# Patient Record
Sex: Male | Born: 1990 | Race: White | Hispanic: No | Marital: Single | Smoking: Never smoker
Health system: Southern US, Community
[De-identification: ages and names within clinical notes are randomized; demographics above are authoritative.]

## PROBLEM LIST (undated history)

## (undated) DIAGNOSIS — F419 Anxiety disorder, unspecified: Secondary | ICD-10-CM

## (undated) DIAGNOSIS — N200 Calculus of kidney: Secondary | ICD-10-CM

## (undated) HISTORY — PX: WISDOM TOOTH EXTRACTION: SHX21

---

## 2013-04-03 ENCOUNTER — Emergency Department (HOSPITAL_BASED_OUTPATIENT_CLINIC_OR_DEPARTMENT_OTHER)
Admission: EM | Admit: 2013-04-03 | Discharge: 2013-04-03 | Disposition: A | Discharge status: Home / Self Care | Payer: 59 | Attending: Emergency Medicine | Admitting: Emergency Medicine

## 2013-04-03 ENCOUNTER — Encounter (HOSPITAL_BASED_OUTPATIENT_CLINIC_OR_DEPARTMENT_OTHER): Payer: Self-pay | Admitting: Emergency Medicine

## 2013-04-03 DIAGNOSIS — N4889 Other specified disorders of penis: Secondary | ICD-10-CM

## 2013-04-03 DIAGNOSIS — N200 Calculus of kidney: Secondary | ICD-10-CM | POA: Insufficient documentation

## 2013-04-03 DIAGNOSIS — N489 Disorder of penis, unspecified: Secondary | ICD-10-CM | POA: Insufficient documentation

## 2013-04-03 HISTORY — DX: Calculus of kidney: N20.0

## 2013-04-03 LAB — URINALYSIS, ROUTINE W REFLEX MICROSCOPIC
Leukocytes, UA: NEGATIVE
Protein, ur: NEGATIVE mg/dL
Specific Gravity, Urine: 1.023 (ref 1.005–1.030)
Urobilinogen, UA: 0.2 mg/dL (ref 0.0–1.0)
pH: 5.5 (ref 5.0–8.0)

## 2013-04-03 LAB — BASIC METABOLIC PANEL
GFR calc Af Amer: >90 mL/min (ref >90)
GFR calc non Af Amer: 85 mL/min — ABNORMAL LOW (ref >90)
Glucose, Bld: 112 mg/dL — ABNORMAL HIGH (ref 70–99)
Potassium: 4.3 mEq/L (ref 3.5–5.1)
Sodium: 139 mEq/L (ref 135–145)

## 2013-04-03 LAB — URINE MICROSCOPIC-ADD ON

## 2013-04-03 MED ORDER — HYDROCODONE-ACETAMINOPHEN 5-325 MG PO TABS
2.0000 | ORAL_TABLET | Freq: Once | ORAL | Status: AC
  Administered 2013-04-03: 2 via ORAL
  Filled 2013-04-03: qty 2

## 2013-04-03 MED ORDER — TAMSULOSIN HCL 0.4 MG PO CAPS: 0.4000 mg | ORAL_CAPSULE | Freq: Once | ORAL | Status: DC

## 2013-04-03 MED ORDER — TAMSULOSIN HCL 0.4 MG PO CAPS
0.4000 mg | ORAL_CAPSULE | Freq: Once | ORAL | Status: AC
  Administered 2013-04-03: 0.4 mg via ORAL
  Filled 2013-04-03: qty 1

## 2013-04-03 MED ORDER — HYDROCODONE-ACETAMINOPHEN 5-325 MG PO TABS: 1.0000 | ORAL_TABLET | Freq: Four times a day (QID) | ORAL | Status: DC | PRN

## 2013-04-03 NOTE — ED Notes (Signed)
Pt reports flank pain this am and possibly passed a kidney stone.  He now has penile pain.

## 2013-04-03 NOTE — ED Notes (Signed)
MD at bedside. 

## 2013-04-03 NOTE — ED Provider Notes (Signed)
CSN: [id number]     Arrival date & time 04/03/13  0848 History   First MD Initiated Contact with Patient 04/03/13 [phone]     Chief Complaint  Patient presents with  . Penis Pain   (Consider location/radiation/quality/duration/timing/severity/associated sxs/prior Treatment) HPI Comments: History of stones. Passed a stone this morning, stinging pain at tip of urethra since. No fever, no vomiting, no hematuria. Has had this stinging sensation once previously. No new partners, no concern for STD.  Patient is a 22 y.o. male presenting with penile pain. The history is provided by the patient.  Penis Pain This is a new problem. The current episode started 3 to 5 hours ago. The problem occurs constantly. The problem has not changed since onset.Pertinent negatives include no chest pain, no abdominal pain and no shortness of breath. Nothing aggravates the symptoms. Nothing relieves the symptoms.    Past Medical History  Diagnosis Date  . Kidney stones    Past Surgical History  Procedure Laterality Date  . Wisdom tooth extraction     No family history on file. History  Substance Use Topics  . Smoking status: Never Smoker   . Smokeless tobacco: Not on file  . Alcohol Use: No    Review of Systems  Constitutional: Negative for fever.  Respiratory: Negative for cough and shortness of breath.   Cardiovascular: Negative for chest pain.  Gastrointestinal: Negative for abdominal pain.  Genitourinary: Positive for dysuria (stinging), flank pain and penile pain (stinging at meatus). Negative for urgency, penile swelling, difficulty urinating and testicular pain.  All other systems reviewed and are negative.    Allergies  Review of patient's allergies indicates no known allergies.  Home Medications   Current Outpatient Rx  Name  Route  Sig  Dispense  Refill  . HYDROcodone-acetaminophen (NORCO/VICODIN) 5-325 MG per tablet   Oral   Take 1 tablet by mouth every 6 (six) hours as needed for  moderate pain.   30 tablet   0   . tamsulosin (FLOMAX) 0.4 MG CAPS capsule   Oral   Take 1 capsule (0.4 mg total) by mouth once.   30 capsule   1    BP 136/91  Pulse 111  Temp(Src) 97.9 F (36.6 C) (Oral)  Resp 16  Ht 5\' 10"  (1.778 m)  Wt 150 lb (68.04 kg)  BMI 21.52 kg/m2  SpO2 97% Physical Exam  Nursing note and vitals reviewed. Constitutional: He is oriented to person, place, and time. He appears well-developed and well-nourished. No distress.  HENT:  Head: Normocephalic and atraumatic.  Mouth/Throat: No oropharyngeal exudate.  Eyes: EOM are normal. Pupils are equal, round, and reactive to light.  Neck: Normal range of motion. Neck supple.  Cardiovascular: Normal rate and regular rhythm.  Exam reveals no friction rub.   No murmur heard. Pulmonary/Chest: Effort normal and breath sounds normal. No respiratory distress. He has no wheezes. He has no rales.  Abdominal: He exhibits no distension. There is no tenderness. There is no rebound.  Genitourinary: Right testis shows no mass, no swelling and no tenderness. Left testis shows no mass, no swelling and no tenderness. No penile erythema or penile tenderness. No discharge found.  Musculoskeletal: Normal range of motion. He exhibits no edema.  Neurological: He is alert and oriented to person, place, and time.  Skin: He is not diaphoretic.    ED Course  Procedures (including critical care time) Labs Review Labs Reviewed  URINALYSIS, ROUTINE W REFLEX MICROSCOPIC - Abnormal; Notable for the following:  Hgb urine dipstick SMALL (*)    All other components within normal limits  BASIC METABOLIC PANEL - Abnormal; Notable for the following:    Glucose, Bld 112 (*)    GFR calc non Af Amer 85 (*)    All other components within normal limits  URINE MICROSCOPIC-ADD ON - Abnormal; Notable for the following:    Bacteria, UA FEW (*)    All other components within normal limits  URINE CULTURE   Imaging Review No results  found.  EKG Interpretation   None       MDM   1. Penis pain   2. Kidney stone    68M with hx of kidney stones presents with urethral pain. He states he passed a kidney stone this morning and has experienced stinging at the tip of his penis since. He has had this before with his kidney stones. He denies any urethral discharge or concern for STD. He denies fevers, vomiting, other systemic symptoms. His exam is benign. He has never required lithotripsy or stent placement for his kidney stones before. I offered CT scan and workup for stones and discussed holding off for now since he has never had a complicated stone and his vitals are stable. He really wants his flomax refilled and declined at CT scan at this time. I feel this is appropriate, he did agree to [facility] and Urine to see if his kidney function is ok and if he is infected. Patient given pain meds and flomax here.  Labs normal. Stable for discharge. Given Urology f/u if needed.    Dagmar Hait, MD 04/03/13 1040

## 2013-04-04 LAB — URINE CULTURE: Culture: NO GROWTH

## 2015-04-14 ENCOUNTER — Encounter (HOSPITAL_BASED_OUTPATIENT_CLINIC_OR_DEPARTMENT_OTHER): Payer: Self-pay | Admitting: *Deleted

## 2015-04-14 ENCOUNTER — Emergency Department (HOSPITAL_BASED_OUTPATIENT_CLINIC_OR_DEPARTMENT_OTHER)
Admission: EM | Admit: 2015-04-14 | Discharge: 2015-04-14 | Disposition: A | Discharge status: Home / Self Care | Payer: 59 | Attending: Emergency Medicine | Admitting: Emergency Medicine

## 2015-04-14 DIAGNOSIS — Z79899 Other long term (current) drug therapy: Secondary | ICD-10-CM | POA: Insufficient documentation

## 2015-04-14 DIAGNOSIS — R109 Unspecified abdominal pain: Secondary | ICD-10-CM | POA: Diagnosis present

## 2015-04-14 DIAGNOSIS — N200 Calculus of kidney: Secondary | ICD-10-CM | POA: Insufficient documentation

## 2015-04-14 LAB — URINALYSIS, ROUTINE W REFLEX MICROSCOPIC
Bilirubin Urine: NEGATIVE
Glucose, UA: NEGATIVE mg/dL
KETONES UR: NEGATIVE mg/dL
LEUKOCYTES UA: NEGATIVE
NITRITE: NEGATIVE
PH: 8.5 — AB (ref 5.0–8.0)
Protein, ur: NEGATIVE mg/dL
SPECIFIC GRAVITY, URINE: 1.018 (ref 1.005–1.030)

## 2015-04-14 LAB — URINE MICROSCOPIC-ADD ON

## 2015-04-14 MED ORDER — ONDANSETRON HCL 4 MG/2ML IJ SOLN
4.0000 mg | Freq: Once | INTRAMUSCULAR | Status: AC
  Administered 2015-04-14: 4 mg via INTRAVENOUS
  Filled 2015-04-14: qty 2

## 2015-04-14 MED ORDER — TAMSULOSIN HCL 0.4 MG PO CAPS
0.4000 mg | ORAL_CAPSULE | Freq: Once | ORAL | Status: AC
  Administered 2015-04-14: 0.4 mg via ORAL
  Filled 2015-04-14: qty 1

## 2015-04-14 MED ORDER — TAMSULOSIN HCL 0.4 MG PO CAPS: 0.4000 mg | ORAL_CAPSULE | Freq: Every day | ORAL | Status: DC

## 2015-04-14 MED ORDER — OXYCODONE-ACETAMINOPHEN 5-325 MG PO TABS: 2.0000 | ORAL_TABLET | ORAL | Status: DC | PRN

## 2015-04-14 MED ORDER — HYDROMORPHONE HCL 1 MG/ML IJ SOLN
1.0000 mg | Freq: Once | INTRAMUSCULAR | Status: AC
  Administered 2015-04-14: 1 mg via INTRAVENOUS
  Filled 2015-04-14: qty 1

## 2015-04-14 MED ORDER — KETOROLAC TROMETHAMINE 30 MG/ML IJ SOLN
30.0000 mg | Freq: Once | INTRAMUSCULAR | Status: AC
  Administered 2015-04-14: 30 mg via INTRAVENOUS
  Filled 2015-04-14: qty 1

## 2015-04-14 NOTE — Discharge Instructions (Signed)
Kidney Stones °Kidney stones (urolithiasis) are deposits that form inside your kidneys. The intense pain is caused by the stone moving through the urinary tract. When the stone moves, the ureter goes into spasm around the stone. The stone is usually passed in the urine.  °CAUSES  °· A disorder that makes certain neck glands produce too much parathyroid hormone (primary hyperparathyroidism). °· A buildup of uric acid crystals, similar to gout in your joints. °· Narrowing (stricture) of the ureter. °· A kidney obstruction present at birth (congenital obstruction). °· Previous surgery on the kidney or ureters. °· Numerous kidney infections. °SYMPTOMS  °· Feeling sick to your stomach (nauseous). °· Throwing up (vomiting). °· Blood in the urine (hematuria). °· Pain that usually spreads (radiates) to the groin. °· Frequency or urgency of urination. °DIAGNOSIS  °· Taking a history and physical exam. °· Blood or urine tests. °· CT scan. °· Occasionally, an examination of the inside of the urinary bladder (cystoscopy) is performed. °TREATMENT  °· Observation. °· Increasing your fluid intake. °· Extracorporeal shock wave lithotripsy--This is a noninvasive procedure that uses shock waves to break up kidney stones. °· Surgery may be needed if you have severe pain or persistent obstruction. There are various surgical procedures. Most of the procedures are performed with the use of small instruments. Only small incisions are needed to accommodate these instruments, so recovery time is minimized. °The size, location, and chemical composition are all important variables that will determine the proper choice of action for you. Talk to your health care provider to better understand your situation so that you will minimize the risk of injury to yourself and your kidney.  °HOME CARE INSTRUCTIONS  °· Drink enough water and fluids to keep your urine clear or pale yellow. This will help you to pass the stone or stone fragments. °· Strain  all urine through the provided strainer. Keep all particulate matter and stones for your health care provider to see. The stone causing the pain may be as small as a grain of salt. It is very important to use the strainer each and every time you pass your urine. The collection of your stone will allow your health care provider to analyze it and verify that a stone has actually passed. The stone analysis will often identify what you can do to reduce the incidence of recurrences. °· Only take over-the-counter or prescription medicines for pain, discomfort, or fever as directed by your health care provider. °· Keep all follow-up visits as told by your health care provider. This is important. °· Get follow-up X-rays if required. The absence of pain does not always mean that the stone has passed. It may have only stopped moving. If the urine remains completely obstructed, it can cause loss of kidney function or even complete destruction of the kidney. It is your responsibility to make sure X-rays and follow-ups are completed. Ultrasounds of the kidney can show blockages and the status of the kidney. Ultrasounds are not associated with any radiation and can be performed easily in a matter of minutes. °· Make changes to your daily diet as told by your health care provider. You may be told to: °¨ Limit the amount of salt that you eat. °¨ Eat 5 or more servings of fruits and vegetables each day. °¨ Limit the amount of meat, poultry, fish, and eggs that you eat. °· Collect a 24-hour urine sample as told by your health care provider. You may need to collect another urine sample every 6-12   months. °SEEK MEDICAL CARE IF: °· You experience pain that is progressive and unresponsive to any pain medicine you have been prescribed. °SEEK IMMEDIATE MEDICAL CARE IF:  °· Pain cannot be controlled with the prescribed medicine. °· You have a fever or shaking chills. °· The severity or intensity of pain increases over 18 hours and is not  relieved by pain medicine. °· You develop a new onset of abdominal pain. °· You feel faint or pass out. °· You are unable to urinate. °  °This information is not intended to replace advice given to you by your health care provider. Make sure you discuss any questions you have with your health care provider. °  °Document Released: 05/16/2005 Document Revised: 02/04/2015 Document Reviewed: 10/17/2012 °Elsevier Interactive Patient Education ©2016 [employer] ° °

## 2015-04-14 NOTE — ED Provider Notes (Signed)
CSN: [id number]     Arrival date & time 04/14/15  1034 History   First MD Initiated Contact with Patient 04/14/15 1112     Chief Complaint  Patient presents with  . Flank Pain     HPI  She presents evaluation of right flank pain. History of kidney stones starting 4 years ago he states he's had "7 or 8". Started having pain in his right flank on Thursday or Friday worsened and became in his right lower quadrant today. 6 Percocet at home and states the pain got away from him and he is unable to control with oral medicines at home and presents here. Emesis 1 this morning some mild nausea now. No gross hematuria.  Past Medical History  Diagnosis Date  . Kidney stones    Past Surgical History  Procedure Laterality Date  . Wisdom tooth extraction     No family history on file. Social History  Substance Use Topics  . Smoking status: Never Smoker   . Smokeless tobacco: None  . Alcohol Use: No    Review of Systems  Constitutional: Negative for fever, chills, diaphoresis, appetite change and fatigue.  HENT: Negative for mouth sores, sore throat and trouble swallowing.   Eyes: Negative for visual disturbance.  Respiratory: Negative for cough, chest tightness, shortness of breath and wheezing.   Cardiovascular: Negative for chest pain.  Gastrointestinal: Positive for nausea, vomiting and abdominal pain. Negative for diarrhea and abdominal distention.  Endocrine: Negative for polydipsia, polyphagia and polyuria.  Genitourinary: Positive for flank pain. Negative for dysuria, frequency and hematuria.  Musculoskeletal: Negative for gait problem.  Skin: Negative for color change, pallor and rash.  Neurological: Negative for dizziness, syncope, light-headedness and headaches.  Hematological: Does not bruise/bleed easily.  Psychiatric/Behavioral: Negative for behavioral problems and confusion.      Allergies  Review of patient's allergies indicates no known allergies.  Home  Medications   Prior to Admission medications   Medication Sig Start Date End Date Taking? Authorizing Provider  citalopram (CELEXA) 10 MG tablet Take 10 mg by mouth daily.   Yes Historical Provider, MD  HYDROcodone-acetaminophen (NORCO/VICODIN) 5-325 MG per tablet Take 1 tablet by mouth every 6 (six) hours as needed for moderate pain. 04/03/13   Elwin MochaBlair Walden, MD  oxyCODONE-acetaminophen (PERCOCET/ROXICET) 5-325 MG tablet Take 2 tablets by mouth every 4 (four) hours as needed. 04/14/15   Rolland PorterMark Azael Ragain, MD  tamsulosin (FLOMAX) 0.4 MG CAPS capsule Take 1 capsule (0.4 mg total) by mouth daily. 04/14/15   Rolland PorterMark Eleshia Wooley, MD   BP 126/83 mmHg  Pulse 104  Temp(Src) 98.4 F (36.9 C) (Oral)  Resp 18  Ht 5\' 10"  (1.778 m)  Wt 155 lb (70.308 kg)  BMI 22.24 kg/m2  SpO2 100% Physical Exam  Constitutional: He is oriented to person, place, and time. He appears well-developed and well-nourished. No distress.  HENT:  Head: Normocephalic.  Eyes: Conjunctivae are normal. Pupils are equal, round, and reactive to light. No scleral icterus.  Neck: Normal range of motion. Neck supple. No thyromegaly present.  Cardiovascular: Normal rate and regular rhythm.  Exam reveals no gallop and no friction rub.   No murmur heard. Pulmonary/Chest: Effort normal and breath sounds normal. No respiratory distress. He has no wheezes. He has no rales.  Abdominal: Soft. Bowel sounds are normal. He exhibits no distension. There is no tenderness. There is no rebound.  Genitourinary:     Musculoskeletal: Normal range of motion.  Neurological: He is alert and oriented to person,  place, and time.  Skin: Skin is warm and dry. No rash noted.  Psychiatric: He has a normal mood and affect. His behavior is normal.    ED Course  Procedures (including critical care time) Labs Review Labs Reviewed  URINALYSIS, ROUTINE W REFLEX MICROSCOPIC (NOT AT [facility]) - Abnormal; Notable for the following:    APPearance CLOUDY (*)    pH 8.5 (*)     Hgb urine dipstick LARGE (*)    All other components within normal limits  URINE MICROSCOPIC-ADD ON - Abnormal; Notable for the following:    Squamous Epithelial / LPF 0-5 (*)    Bacteria, UA RARE (*)    All other components within normal limits    Imaging Review No results found. I have personally reviewed and evaluated these images and lab results as part of my medical decision-making.   EKG Interpretation None      MDM   Final diagnoses:  Kidney stone    Patient with excellent pain control. Given first dose by mouth Percocet following IV medications. Continues to tolerate symptoms well. Urine shows blood. No sign of infection. Imaging not indicated. He is appropriate for outpatient treatment with expectant management, pain control, hydration. Urological follow-up if not improving opacity within the next 5-7 days. ER with acute changes or worsening.    Rolland Porter, MD 04/14/15 [phone]

## 2015-04-14 NOTE — ED Notes (Signed)
Patient states he developed right flank area.  This morning the pain intensified and is the worse pain he has ever had with kidney stone.

## 2015-04-22 ENCOUNTER — Emergency Department (HOSPITAL_BASED_OUTPATIENT_CLINIC_OR_DEPARTMENT_OTHER): Payer: 59

## 2015-04-22 ENCOUNTER — Emergency Department (HOSPITAL_BASED_OUTPATIENT_CLINIC_OR_DEPARTMENT_OTHER)
Admission: EM | Admit: 2015-04-22 | Discharge: 2015-04-22 | Disposition: A | Discharge status: Home / Self Care | Payer: 59 | Attending: Physician Assistant | Admitting: Physician Assistant

## 2015-04-22 ENCOUNTER — Encounter (HOSPITAL_BASED_OUTPATIENT_CLINIC_OR_DEPARTMENT_OTHER): Payer: Self-pay | Admitting: Emergency Medicine

## 2015-04-22 DIAGNOSIS — Z79899 Other long term (current) drug therapy: Secondary | ICD-10-CM | POA: Insufficient documentation

## 2015-04-22 DIAGNOSIS — R109 Unspecified abdominal pain: Secondary | ICD-10-CM

## 2015-04-22 DIAGNOSIS — N200 Calculus of kidney: Secondary | ICD-10-CM | POA: Diagnosis not present

## 2015-04-22 LAB — CBC
HEMATOCRIT: 43.7 % (ref 39.0–52.0)
Hemoglobin: 15.4 g/dL (ref 13.0–17.0)
MCH: 32 pg (ref 26.0–34.0)
MCHC: 35.2 g/dL (ref 30.0–36.0)
MCV: 90.9 fL (ref 78.0–100.0)
Platelets: 253 10*3/uL (ref 150–400)
RBC: 4.81 MIL/uL (ref 4.22–5.81)
RDW: 12.1 % (ref 11.5–15.5)
WBC: 9.8 10*3/uL (ref 4.0–10.5)

## 2015-04-22 LAB — COMPREHENSIVE METABOLIC PANEL
ALK PHOS: 53 U/L (ref 38–126)
ALT: 14 U/L — AB (ref 17–63)
AST: 17 U/L (ref 15–41)
Albumin: 4.6 g/dL (ref 3.5–5.0)
Anion gap: 7 (ref 5–15)
BUN: 20 mg/dL (ref 6–20)
CALCIUM: 9.5 mg/dL (ref 8.9–10.3)
CO2: 27 mmol/L (ref 22–32)
CREATININE: 1.71 mg/dL — AB (ref 0.61–1.24)
Chloride: 103 mmol/L (ref 101–111)
GFR calc non Af Amer: 54 mL/min — ABNORMAL LOW (ref >60)
Glucose, Bld: 106 mg/dL — ABNORMAL HIGH (ref 65–99)
Potassium: 4.2 mmol/L (ref 3.5–5.1)
Sodium: 137 mmol/L (ref 135–145)
TOTAL PROTEIN: 7.5 g/dL (ref 6.5–8.1)
Total Bilirubin: 1.1 mg/dL (ref 0.3–1.2)

## 2015-04-22 LAB — URINALYSIS, ROUTINE W REFLEX MICROSCOPIC
Bilirubin Urine: NEGATIVE
Glucose, UA: NEGATIVE mg/dL
Ketones, ur: NEGATIVE mg/dL
Leukocytes, UA: NEGATIVE
NITRITE: NEGATIVE
Protein, ur: NEGATIVE mg/dL
SPECIFIC GRAVITY, URINE: 1.017 (ref 1.005–1.030)
pH: 6 (ref 5.0–8.0)

## 2015-04-22 LAB — URINE MICROSCOPIC-ADD ON

## 2015-04-22 LAB — LIPASE, BLOOD: LIPASE: 26 U/L (ref 11–51)

## 2015-04-22 MED ORDER — KETOROLAC TROMETHAMINE 30 MG/ML IJ SOLN
30.0000 mg | Freq: Once | INTRAMUSCULAR | Status: AC
  Administered 2015-04-22: 30 mg via INTRAVENOUS
  Filled 2015-04-22: qty 1

## 2015-04-22 MED ORDER — ONDANSETRON 4 MG PO TBDP
4.0000 mg | ORAL_TABLET | Freq: Once | ORAL | Status: AC
  Administered 2015-04-22: 4 mg via ORAL
  Filled 2015-04-22: qty 1

## 2015-04-22 MED ORDER — SODIUM CHLORIDE 0.9 % IV BOLUS (SEPSIS): 1000.0000 mL | Freq: Once | INTRAVENOUS | Status: DC

## 2015-04-22 MED ORDER — SODIUM CHLORIDE 0.9 % IV BOLUS (SEPSIS)
1000.0000 mL | Freq: Once | INTRAVENOUS | Status: AC
  Administered 2015-04-22: 1000 mL via INTRAVENOUS

## 2015-04-22 MED ORDER — TAMSULOSIN HCL 0.4 MG PO CAPS: 0.4000 mg | ORAL_CAPSULE | Freq: Every day | ORAL | Status: DC

## 2015-04-22 MED ORDER — ONDANSETRON HCL 4 MG/2ML IJ SOLN
4.0000 mg | Freq: Once | INTRAMUSCULAR | Status: AC
  Administered 2015-04-22: 4 mg via INTRAVENOUS
  Filled 2015-04-22: qty 2

## 2015-04-22 MED ORDER — ONDANSETRON HCL 4 MG PO TABS: 4.0000 mg | ORAL_TABLET | Freq: Three times a day (TID) | ORAL | Status: DC | PRN

## 2015-04-22 MED ORDER — MORPHINE SULFATE (PF) 4 MG/ML IV SOLN
4.0000 mg | Freq: Once | INTRAVENOUS | Status: AC
  Administered 2015-04-22: 4 mg via INTRAVENOUS
  Filled 2015-04-22: qty 1

## 2015-04-22 MED ORDER — OXYCODONE-ACETAMINOPHEN 5-325 MG PO TABS: 1.0000 | ORAL_TABLET | Freq: Four times a day (QID) | ORAL | Status: AC | PRN

## 2015-04-22 MED ORDER — ONDANSETRON 4 MG PO TBDP: 4.0000 mg | ORAL_TABLET | Freq: Once | ORAL | Status: AC

## 2015-04-22 NOTE — ED Notes (Signed)
Patient transported to CT 

## 2015-04-22 NOTE — ED Notes (Signed)
Pt diagnosed with kidney stone on 11/15 returns with increased pain to right flank despite pain medication, has appoint with urologist in 2 weeks

## 2015-04-22 NOTE — ED Provider Notes (Addendum)
CSN: [id number]     Arrival date & time 04/22/15  0915 History   First MD Initiated Contact with Patient 04/22/15 [phone]     Chief Complaint  Patient presents with  . Flank Pain     (Consider location/radiation/quality/duration/timing/severity/associated sxs/prior Treatment) HPI   Patient is a 24 year old male with history of kidney stone. Patient's had over 8 kidney stones in the past. He is presenting with his mom today. He reports he was seen last week for similar thing. No imaging done at the time. He reports that the pain is has not really gone away since last week. Patient was told to return if the pain continued. Patient's been taking his oral pain indications but recently been vomiting them and the pain has been increasing. Patient's noted no fever, no burning when he urinates.  Past Medical History  Diagnosis Date  . Kidney stones    Past Surgical History  Procedure Laterality Date  . Wisdom tooth extraction     History reviewed. No pertinent family history. Social History  Substance Use Topics  . Smoking status: Never Smoker   . Smokeless tobacco: None  . Alcohol Use: No    Review of Systems  Constitutional: Negative for fever and activity change.  HENT: Negative for congestion.   Eyes: Negative for discharge.  Respiratory: Negative for cough and shortness of breath.   Cardiovascular: Negative for chest pain.  Gastrointestinal: Negative for abdominal pain.  Genitourinary: Positive for flank pain. Negative for dysuria, urgency and hematuria.  Musculoskeletal: Negative for arthralgias.  Allergic/Immunologic: Negative for immunocompromised state.  Psychiatric/Behavioral: Negative for agitation.  All other systems reviewed and are negative.     Allergies  Review of patient's allergies indicates no known allergies.  Home Medications   Prior to Admission medications   Medication Sig Start Date End Date Taking? Authorizing Provider  HYDROcodone-acetaminophen  (NORCO/VICODIN) 5-325 MG per tablet Take 1 tablet by mouth every 6 (six) hours as needed for moderate pain. 04/03/13  Yes Elwin Mocha, MD  oxyCODONE-acetaminophen (PERCOCET/ROXICET) 5-325 MG tablet Take 2 tablets by mouth every 4 (four) hours as needed. 04/14/15  Yes Rolland Porter, MD  tamsulosin (FLOMAX) 0.4 MG CAPS capsule Take 1 capsule (0.4 mg total) by mouth daily. 04/14/15  Yes Rolland Porter, MD  citalopram (CELEXA) 10 MG tablet Take 10 mg by mouth daily.    Historical Provider, MD  ondansetron (ZOFRAN) 4 MG tablet Take 1 tablet (4 mg total) by mouth every 8 (eight) hours as needed for nausea or vomiting. 04/22/15   Ivoree Felmlee Lyn Tanis Burnley, MD  oxyCODONE-acetaminophen (PERCOCET/ROXICET) 5-325 MG tablet Take 1 tablet by mouth every 6 (six) hours as needed for severe pain. 04/22/15   Price Lachapelle Lyn Janayah Zavada, MD  tamsulosin (FLOMAX) 0.4 MG CAPS capsule Take 1 capsule (0.4 mg total) by mouth daily. 04/22/15   Kathrine Rieves Lyn Archer Moist, MD   BP 134/97 mmHg  Pulse 83  Temp(Src) 97.9 F (36.6 C) (Oral)  Resp 20  Ht  (1.778 m)  Wt 155 lb (70.308 kg)  BMI 22.24 kg/m2  SpO2 100% Physical Exam  Constitutional: He is oriented to person, place, and time. He appears well-nourished.  HENT:  Head: Normocephalic.  Mouth/Throat: Oropharynx is clear and moist.  Eyes: Conjunctivae are normal.  Neck: No tracheal deviation present.  Cardiovascular: Normal rate.   Pulmonary/Chest: Effort normal. No stridor. No respiratory distress.  Abdominal: Soft. There is no tenderness. There is no guarding.  Mild CVA tenderness  Musculoskeletal: Normal range of motion. He  exhibits no edema.  Neurological: He is oriented to person, place, and time. No cranial nerve deficit.  Skin: Skin is warm and dry. No rash noted. He is not diaphoretic.  Psychiatric: He has a normal mood and affect. His behavior is normal.  Nursing note and vitals reviewed.   ED Course  Procedures (including critical care time) Labs Review Labs  Reviewed  URINALYSIS, ROUTINE W REFLEX MICROSCOPIC (NOT [facility]) - Abnormal; Notable for the following:    Hgb urine dipstick TRACE (*)    All other components within normal limits  COMPREHENSIVE METABOLIC PANEL - Abnormal; Notable for the following:    Glucose, Bld 106 (*)    Creatinine, Ser 1.71 (*)    ALT 14 (*)    GFR calc non Af Amer 54 (*)    All other components within normal limits  URINE MICROSCOPIC-ADD ON - Abnormal; Notable for the following:    Squamous Epithelial / LPF 0-5 (*)    Bacteria, UA FEW (*)    All other components within normal limits  URINE CULTURE  CBC  LIPASE, BLOOD    Imaging Review Ct Renal Stone Study  04/22/2015  CLINICAL DATA:  Right flank pain for 1 week which continues to worsen. Hematuria. EXAM: CT ABDOMEN AND PELVIS WITHOUT CONTRAST TECHNIQUE: Multidetector CT imaging of the abdomen and pelvis was performed following the standard protocol without IV contrast. COMPARISON:  None. FINDINGS: Lower chest: Lung bases show no acute findings. Heart size normal. No pericardial or pleural effusion. Hepatobiliary: The liver and gallbladder are unremarkable. No biliary ductal dilatation. Pancreas: Negative. Spleen: Negative. Adrenals/Urinary Tract: Adrenal glands are unremarkable. Tiny stones in the kidneys bilaterally. Moderate right hydronephrosis secondary to 2 stones in the distal right ureter, near the right ureterovesical junction, measuring 2 mm and 3 mm, respectively. Left ureter is decompressed. Bladder is grossly unremarkable. Stomach/Bowel: Stomach, small bowel and colon are unremarkable. Appendix is not readily visualized. Vascular/Lymphatic: Vascular structures are unremarkable. No pathologically enlarged lymph nodes. Reproductive: Prostate is normal in size. Other: No free fluid.  Mesenteries and peritoneum are unremarkable. Musculoskeletal: No worrisome lytic or sclerotic lesions. IMPRESSION: 1. Moderate right hydronephrosis secondary to 2 stones in the  distal right ureter, near the right ureterovesical junction. 2. Bilateral renal stones. Electronically Signed   By: Leanna BattlesMelinda  Blietz M.D.   On: 04/22/2015 11:21   I have personally reviewed and evaluated these images and lab results as part of my medical decision-making.   EKG Interpretation None      MDM   Final diagnoses:  Right flank pain  Kidney stone    Patient is a healthy 24 year old male presenting with kidney stone pain. Patient seen for similar thing last week. He is now unable to tolerate oral medications and he feels the pain has been increasing. Concern today for a infected stone versus obstructive stone. Will get CAT scan without contrast to better elucidate. In addition we'll get labs, give fluids, antiemetics, pain control. We'll check urine for signs of infection.  No obstructing stone or infection. Will get pain control, flomax. Pt has appointment with urology in one week.     Evie Crumpler Randall AnLyn Domingo Fuson, MD 04/23/15 0900  Kyzen Horn Randall AnLyn Randall Colden, MD 04/23/15 [phone]

## 2015-04-22 NOTE — ED Notes (Signed)
Pt tolerated ginger ale.  

## 2015-04-22 NOTE — Discharge Instructions (Signed)
You were seen for kidney stones today. Please follow-up with your regular physician if you needs more pain medication or nausea medication. Your US today showed that your kidney stones are very small and should pass on their own. Please follow up with urology as you are planning to do.

## 2015-04-23 LAB — URINE CULTURE: Culture: NO GROWTH

## 2016-05-25 IMAGING — CT CT RENAL STONE PROTOCOL
2 of 4 series · 16 of 46 positions shown, 18 images · non-contrast
Comparison: None.

CLINICAL DATA: Right flank pain for 1 week which continues to
worsen. Hematuria.

EXAM:
CT ABDOMEN AND PELVIS WITHOUT CONTRAST
TECHNIQUE: Multidetector CT imaging of the abdomen and pelvis was performed
following the standard protocol without IV contrast.

[Series 2: renal stone < 200 lbs 5.0 b31f · axial · 0.65mm/px · z∈[+679,+1114]mm · 13 of 95 slices shown, 15 images]
[im 4/95  soft-tissue]
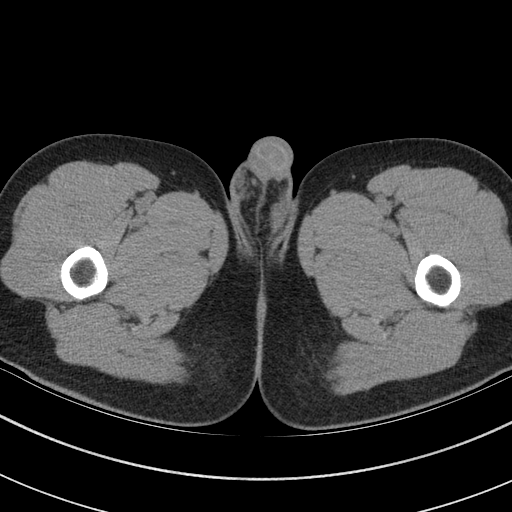
[im 4/95  bone]
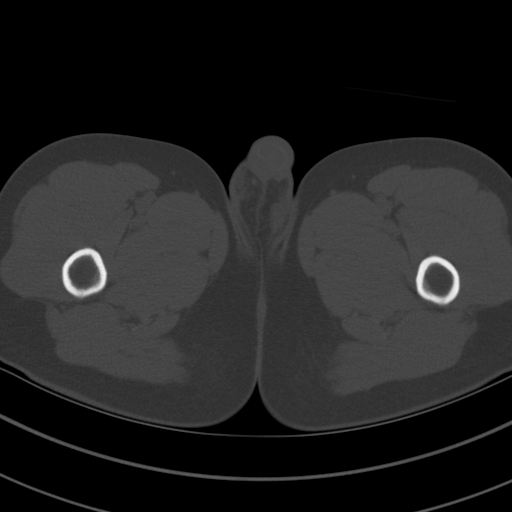
[im 12/95  soft-tissue]
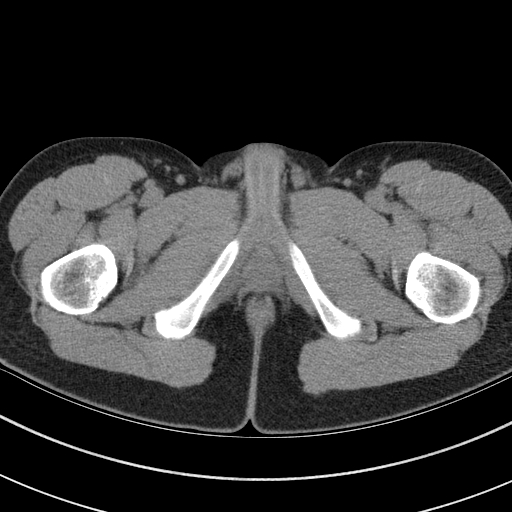
[im 20/95  soft-tissue]
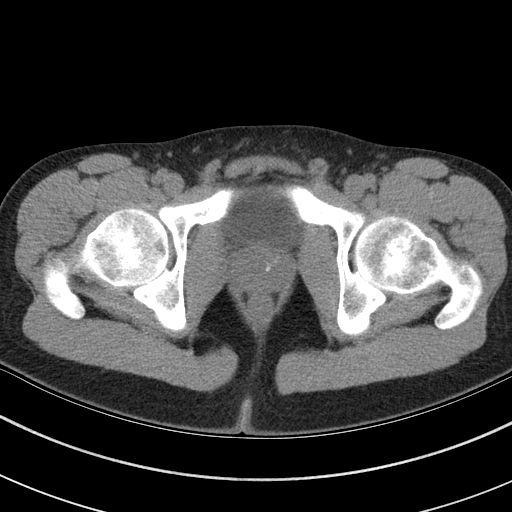
[im 28/95  soft-tissue]
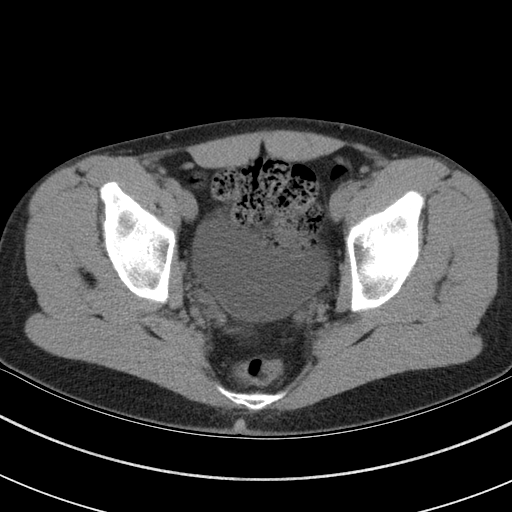
[im 32/95  soft-tissue]
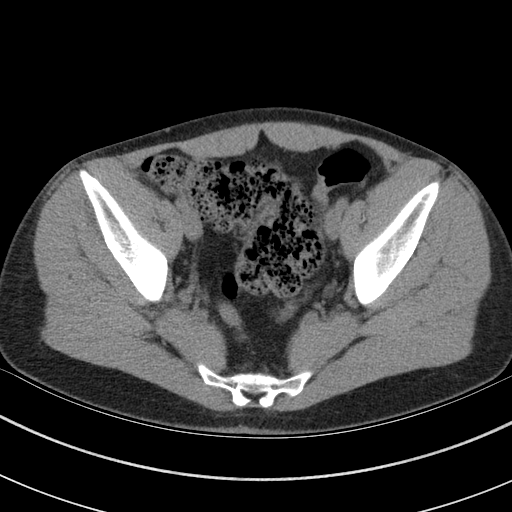
[im 40/95  soft-tissue]
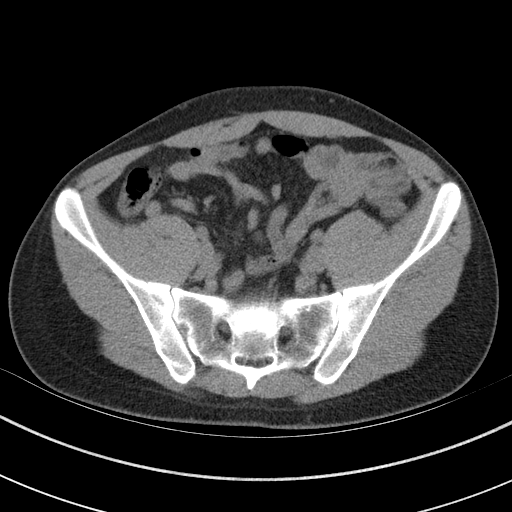
[im 48/95  soft-tissue]
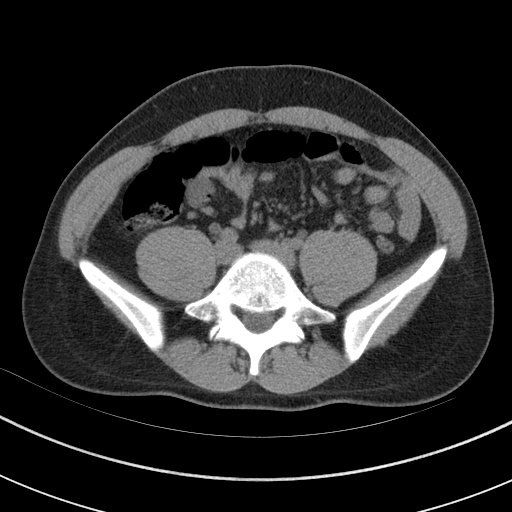
[im 55/95  soft-tissue]
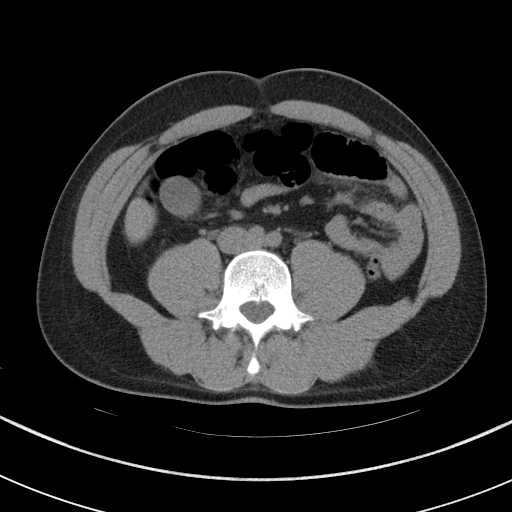
[im 63/95  soft-tissue]
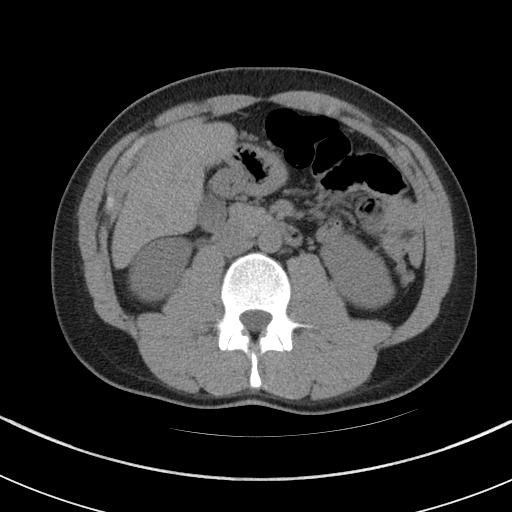
[im 63/95  bone]
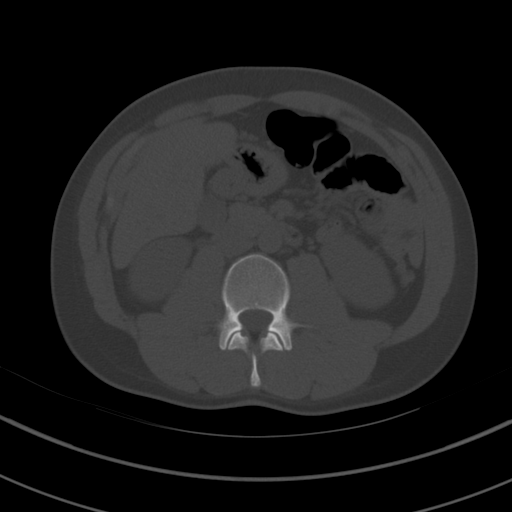
[im 67/95  soft-tissue]
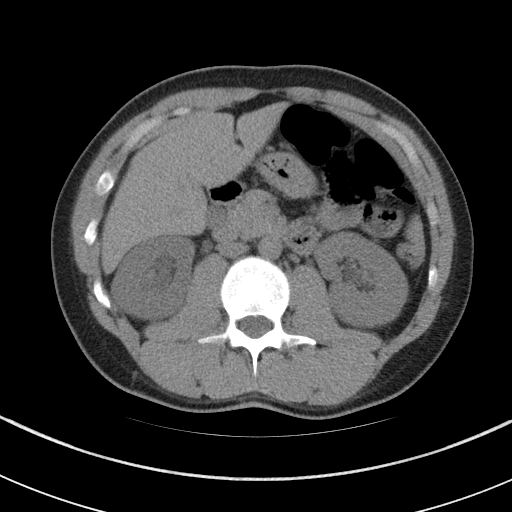
[im 75/95  soft-tissue]
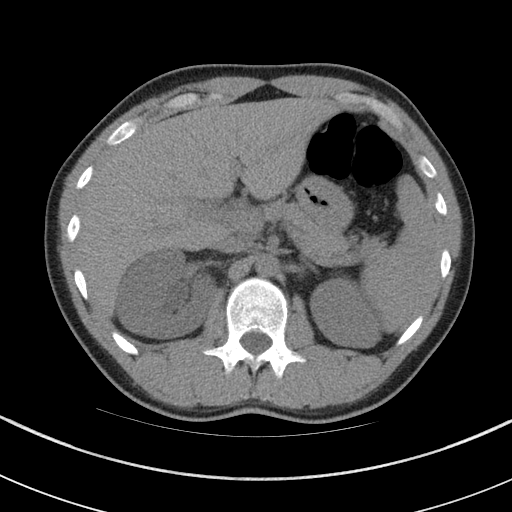
[im 83/95  soft-tissue]
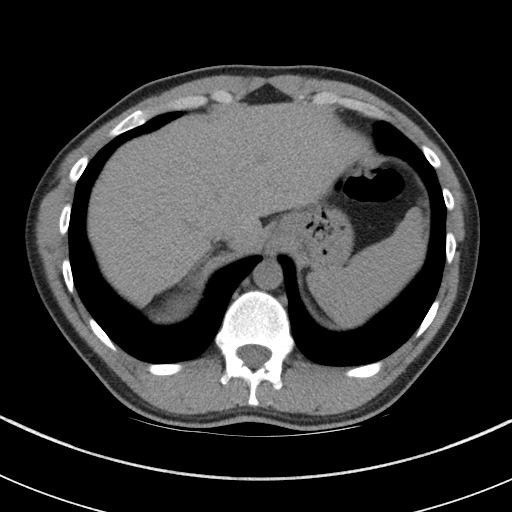
[im 91/95  soft-tissue]
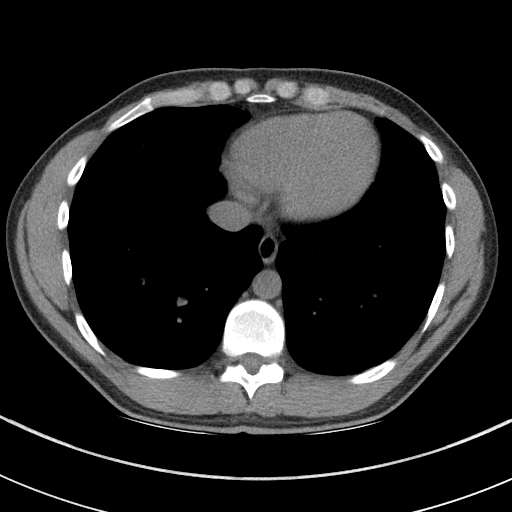

[Series 5: renal stone 3.0 coronal · coronal · 0.71mm/px · 3 of 81 slices shown]
[im 27/81  soft-tissue]
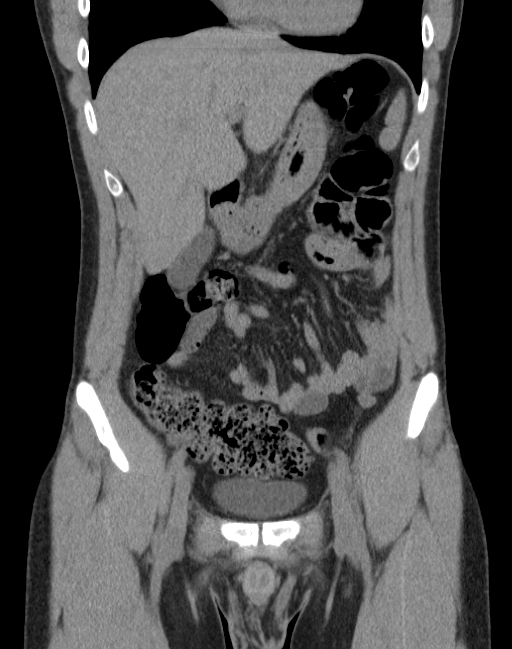
[im 36/81  soft-tissue]
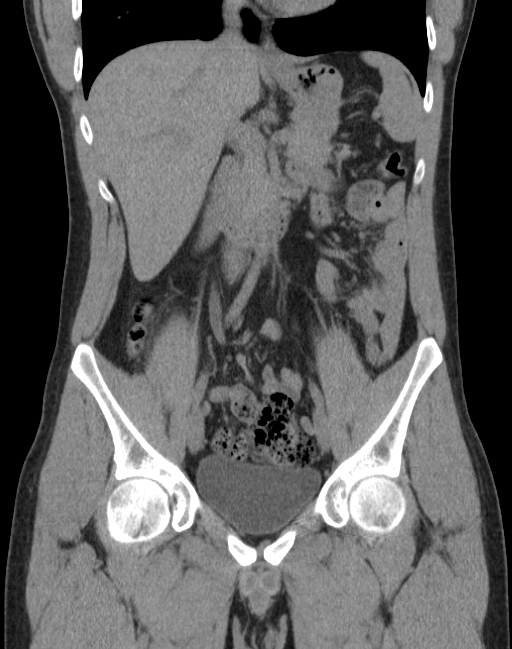
[im 45/81  soft-tissue]
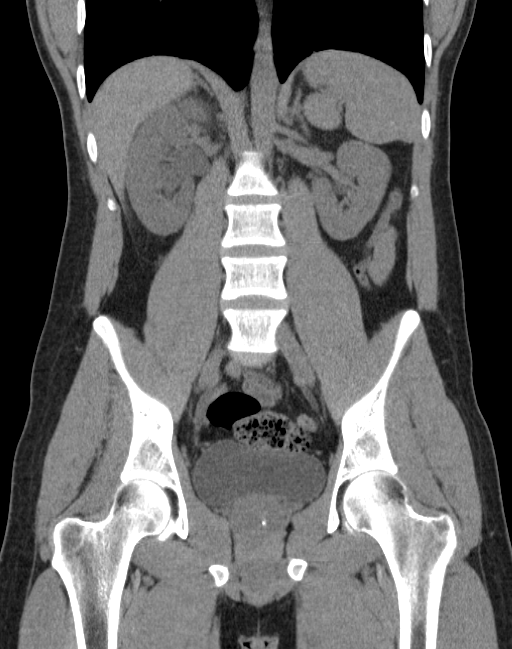

[16 of 46 positions shown; findings below may reference images not displayed]

FINDINGS: Lower chest: Lung bases show no acute findings. Heart size normal.
No pericardial or pleural effusion.

Hepatobiliary: The liver and gallbladder are unremarkable. No
biliary ductal dilatation.

Pancreas: Negative.

Spleen: Negative.

Adrenals/Urinary Tract: Adrenal glands are unremarkable. Tiny stones
in the kidneys bilaterally. Moderate right hydronephrosis secondary
to 2 stones in the distal right ureter, near the right
ureterovesical junction, measuring 2 mm and 3 mm, respectively. Left
ureter is decompressed. Bladder is grossly unremarkable.

Stomach/Bowel: Stomach, small bowel and colon are unremarkable.
Appendix is not readily visualized.

Vascular/Lymphatic: Vascular structures are unremarkable. No
pathologically enlarged lymph nodes.

Reproductive: Prostate is normal in size.

Other: No free fluid.  Mesenteries and peritoneum are unremarkable.

Musculoskeletal: No worrisome lytic or sclerotic lesions.
IMPRESSION: 1. Moderate right hydronephrosis secondary to 2 stones in the distal
right ureter, near the right ureterovesical junction.
2. Bilateral renal stones.

## 2016-08-29 ENCOUNTER — Emergency Department (HOSPITAL_BASED_OUTPATIENT_CLINIC_OR_DEPARTMENT_OTHER): Payer: 59

## 2016-08-29 ENCOUNTER — Encounter (HOSPITAL_BASED_OUTPATIENT_CLINIC_OR_DEPARTMENT_OTHER): Payer: Self-pay

## 2016-08-29 ENCOUNTER — Emergency Department (HOSPITAL_BASED_OUTPATIENT_CLINIC_OR_DEPARTMENT_OTHER)
Admission: EM | Admit: 2016-08-29 | Discharge: 2016-08-29 | Disposition: A | Discharge status: Home / Self Care | Payer: 59 | Attending: Physician Assistant | Admitting: Physician Assistant

## 2016-08-29 DIAGNOSIS — R42 Dizziness and giddiness: Secondary | ICD-10-CM

## 2016-08-29 DIAGNOSIS — Z79899 Other long term (current) drug therapy: Secondary | ICD-10-CM | POA: Diagnosis not present

## 2016-08-29 HISTORY — DX: Anxiety disorder, unspecified: F41.9

## 2016-08-29 LAB — CBC WITH DIFFERENTIAL/PLATELET
BASOS ABS: 0 10*3/uL (ref 0.0–0.1)
BASOS PCT: 0 %
EOS PCT: 0 %
Eosinophils Absolute: 0 10*3/uL (ref 0.0–0.7)
HCT: 49.8 % (ref 39.0–52.0)
Hemoglobin: 18.1 g/dL — ABNORMAL HIGH (ref 13.0–17.0)
LYMPHS PCT: 13 %
Lymphs Abs: 1.3 10*3/uL (ref 0.7–4.0)
MCH: 33.6 pg (ref 26.0–34.0)
MCHC: 36.3 g/dL — AB (ref 30.0–36.0)
MCV: 92.6 fL (ref 78.0–100.0)
MONO ABS: 0.9 10*3/uL (ref 0.1–1.0)
MONOS PCT: 9 %
Neutro Abs: 7.8 10*3/uL — ABNORMAL HIGH (ref 1.7–7.7)
Neutrophils Relative %: 78 %
PLATELETS: 285 10*3/uL (ref 150–400)
RBC: 5.38 MIL/uL (ref 4.22–5.81)
RDW: 12.6 % (ref 11.5–15.5)
WBC: 10 10*3/uL (ref 4.0–10.5)

## 2016-08-29 LAB — COMPREHENSIVE METABOLIC PANEL
ALBUMIN: 5.1 g/dL — AB (ref 3.5–5.0)
ALK PHOS: 55 U/L (ref 38–126)
ALT: 16 U/L — ABNORMAL LOW (ref 17–63)
AST: 23 U/L (ref 15–41)
Anion gap: 11 (ref 5–15)
BILIRUBIN TOTAL: 0.8 mg/dL (ref 0.3–1.2)
BUN: 20 mg/dL (ref 6–20)
CALCIUM: 9.6 mg/dL (ref 8.9–10.3)
CO2: 26 mmol/L (ref 22–32)
Chloride: 102 mmol/L (ref 101–111)
Creatinine, Ser: 1.09 mg/dL (ref 0.61–1.24)
GFR calc Af Amer: >60 mL/min (ref >60)
GFR calc non Af Amer: >60 mL/min (ref >60)
GLUCOSE: 90 mg/dL (ref 65–99)
Potassium: 4.3 mmol/L (ref 3.5–5.1)
Sodium: 139 mmol/L (ref 135–145)
TOTAL PROTEIN: 8.2 g/dL — AB (ref 6.5–8.1)

## 2016-08-29 LAB — RAPID URINE DRUG SCREEN, HOSP PERFORMED
Amphetamines: NOT DETECTED
BARBITURATES: NOT DETECTED
Benzodiazepines: NOT DETECTED
COCAINE: NOT DETECTED
Opiates: NOT DETECTED
Tetrahydrocannabinol: NOT DETECTED

## 2016-08-29 LAB — TSH: TSH: 0.887 u[IU]/mL (ref 0.350–4.500)

## 2016-08-29 LAB — CBG MONITORING, ED
GLUCOSE-CAPILLARY: 80 mg/dL (ref 65–99)
Glucose-Capillary: 101 mg/dL — ABNORMAL HIGH (ref 65–99)

## 2016-08-29 LAB — D-DIMER, QUANTITATIVE (NOT AT ARMC)

## 2016-08-29 LAB — TROPONIN I: Troponin I: <0.03 ng/mL (ref <0.03)

## 2016-08-29 MED ORDER — LORAZEPAM 1 MG PO TABS
0.5000 mg | ORAL_TABLET | Freq: Once | ORAL | Status: AC
  Administered 2016-08-29: 0.5 mg via ORAL
  Filled 2016-08-29: qty 1

## 2016-08-29 MED ORDER — SODIUM CHLORIDE 0.9 % IV BOLUS (SEPSIS)
1000.0000 mL | Freq: Once | INTRAVENOUS | Status: AC
  Administered 2016-08-29: 1000 mL via INTRAVENOUS

## 2016-08-29 NOTE — ED Provider Notes (Signed)
[facility] Provider Note   CSN: [id number] Arrival date & time: 08/29/16  1129     History   Chief Complaint Chief Complaint  Patient presents with  . Dizziness    HPI Joshua Cowan is a 26 y.o. male.  HPI  Patient is 26 year old male with history of anxiety. Patient is here because she's had 3 episodes of dizziness in the last several weeks. About 3 weeks ago he had one episode since resolved. He had another episode 2 days ago. And the third episode this morning. This happened while at work. Patient had a glass of orange juice and coffee in the morning went to work and then felt dizzy. Patient was brought to urgent care by a coworker. His sugars found to be 56. After peanut butter and crackers it only went up to 74, was sent here to emergency room.  Patient had no nausea no vomiting no diarrhea no fevers no cough or congestion no dysuria. Patient denies taking any medications except his Cymbalta which she was started on 2 weeks ago.  Patient reports alcohol use sveral times a week, moderate use. No drug use. No recent unprotected sex Past Medical History:  Diagnosis Date  . Anxiety   . Kidney stones     There are no active problems to display for this patient.   Past Surgical History:  Procedure Laterality Date  . WISDOM TOOTH EXTRACTION         Home Medications    Prior to Admission medications   Medication Sig Start Date End Date Taking? Authorizing Provider  DULoxetine (CYMBALTA) 30 MG capsule Take 30 mg by mouth daily.   Yes Historical Provider, MD  traZODone (DESYREL) 50 MG tablet Take 50 mg by mouth at bedtime.   Yes Historical Provider, MD  oxyCODONE-acetaminophen (PERCOCET/ROXICET) 5-325 MG tablet Take 1 tablet by mouth every 6 (six) hours as needed for severe pain. 04/22/15   Paris [place] Lyn Eara Burruel, MD    Family History No family history on file.  Social History Social History  Substance Use Topics  . Smoking status: Never Smoker  .  Smokeless tobacco: Never Used  . Alcohol use Yes     Comment: occ     Allergies   Patient has no known allergies.   Review of Systems Review of Systems  Constitutional: Negative for activity change, fatigue and fever.  HENT: Negative for congestion.   Eyes: Positive for visual disturbance.  Respiratory: Negative for shortness of breath.   Cardiovascular: Negative for chest pain.  Gastrointestinal: Negative for abdominal pain, nausea and vomiting.  Neurological: Positive for light-headedness.  Psychiatric/Behavioral: Negative for suicidal ideas. The patient is nervous/anxious.      Physical Exam Updated Vital Signs BP (!) 145/97   Pulse (!) 109   Temp 99 F (37.2 C) (Oral)   Resp 16   Ht  (1.778 m)   Wt 150 lb (68 kg)   SpO2 100%   BMI 21.52 kg/m   Physical Exam   ED Treatments / Results  Labs (all labs ordered are listed, but only abnormal results are displayed) Labs Reviewed  CBG MONITORING, ED - Abnormal; Notable for the following:       Result Value   Glucose-Capillary 101 (*)    All other components within normal limits  COMPREHENSIVE METABOLIC PANEL  CBC WITH DIFFERENTIAL/PLATELET  TROPONIN I  RAPID URINE DRUG SCREEN, HOSP PERFORMED  TSH    EKG  EKG Interpretation  Date/Time:  Monday August 29 2016 11:45:45 EDT Ventricular Rate:  137 PR Interval:  140 QRS Duration: 86 QT Interval:  282 QTC Calculation: 425 R Axis:   98 Text Interpretation:  Sinus tachycardia Right atrial enlargement Rightward axis Borderline ECG Sinus tachycardia Confirmed by Kandis Mannan ([id number]) on 08/29/2016 12:29:56 PM       Radiology No results found.  Procedures Procedures (including critical care time)  Medications Ordered in ED Medications  sodium chloride 0.9 % bolus 1,000 mL (1,000 mLs Intravenous New Bag/Given 08/29/16 1242)     Initial Impression / Assessment and Plan / ED Course  I have reviewed the triage vital signs and the nursing  notes.  Pertinent labs & imaging results that were available during my care of the patient were reviewed by me and considered in my medical decision making (see chart for details).    Patient well-appearing healthy 26 year old male with recent starting Cymbalta here with dizziness. Patient has a lot of anxiety. Initially heart rate is elevated and patient appeared anxious on exam. We'll give him fluids, get CBC chem 10, CT head (given that he complained of some visual disturbances). I think this is likely medication effect versus anxiety.  Patient has normal neurologic exam. Normal physical exam.  D dimer negative  After 2L of patient he is still mildly tachy (101). However after 0.5 mg of Ativan patients HR immediatley corrected. It is possible this patient was not truthful about his alcohol use, and he is withdrawing. Or is possibly patient is having anxiety. Either way it is very reassuring. We'll have patient follow up with his primary care physician. We'll have him stop Cymbalta until he follows up.     Final Clinical Impressions(s) / ED Diagnoses   Final diagnoses:  None    New Prescriptions New Prescriptions   No medications on file     Raianna Slight Randall An, MD 08/31/16 1559

## 2016-08-29 NOTE — Discharge Instructions (Signed)
You were seen today with lightheadedness. We did an extensive workup up including CAT scan, chest x-ray, lab work. Everything appears reassuring. It could be that you are dehydrated. Be sure to take drink plenty of fluids home. Additionally could be due to anxiety or withdrawal. Please follow-up with your primary care physician as soon as possible for further workup.  To find a primary care or specialty doctor please call [phone] or [phone] to access "[facility] Find a Doctor Service."  You may also go on the [facility] website at [email or web address]  There are also multiple [facility] and [facility] practices throughout the [place] that are frequently accepting new patients. You may find a clinic that is close to your home and contact them.  [facility] -  [address] [place] [address] [phone]  [facility] in [place] (also locations in [place] and [place]) -  [address] [place] [address] [phone]  [facility] Department -  [address] [place] [address] [phone]

## 2016-08-29 NOTE — ED Triage Notes (Signed)
c/o dizziness started last night-episode approx 3 weeks ago-no medical tx at that time-[facility] seen at [facility]-reports BS was 54-given a snack-repeat BS 74-pt NAD with slow steady gait-denies pain

## 2016-08-29 NOTE — ED Notes (Signed)
Pt placed on monitor.  

## 2016-08-29 NOTE — ED Notes (Signed)
Pt's mother-in-law sts pt said he was having trouble remembering what he was about to say; also c/o nausea and increased dizziness, but accepted offer of graham crackers and peanut butter.

## 2016-08-29 NOTE — ED Notes (Signed)
Patient transported to X-ray 

## 2016-08-29 NOTE — ED Notes (Signed)
ED Provider at bedside. 

## 2016-08-29 NOTE — ED Notes (Signed)
Pt's mother is going to get him something to eat from a restaurant.

## 2016-08-29 NOTE — ED Notes (Signed)
Patient denies pain and is resting comfortably.  

## 2017-10-02 IMAGING — CR DG CHEST 2V
2 series · 2 of 2 positions shown · non-contrast
Comparison: None.

CLINICAL DATA: Dizziness beginning last night.

EXAM:
CHEST  2 VIEW

[w chest pa]
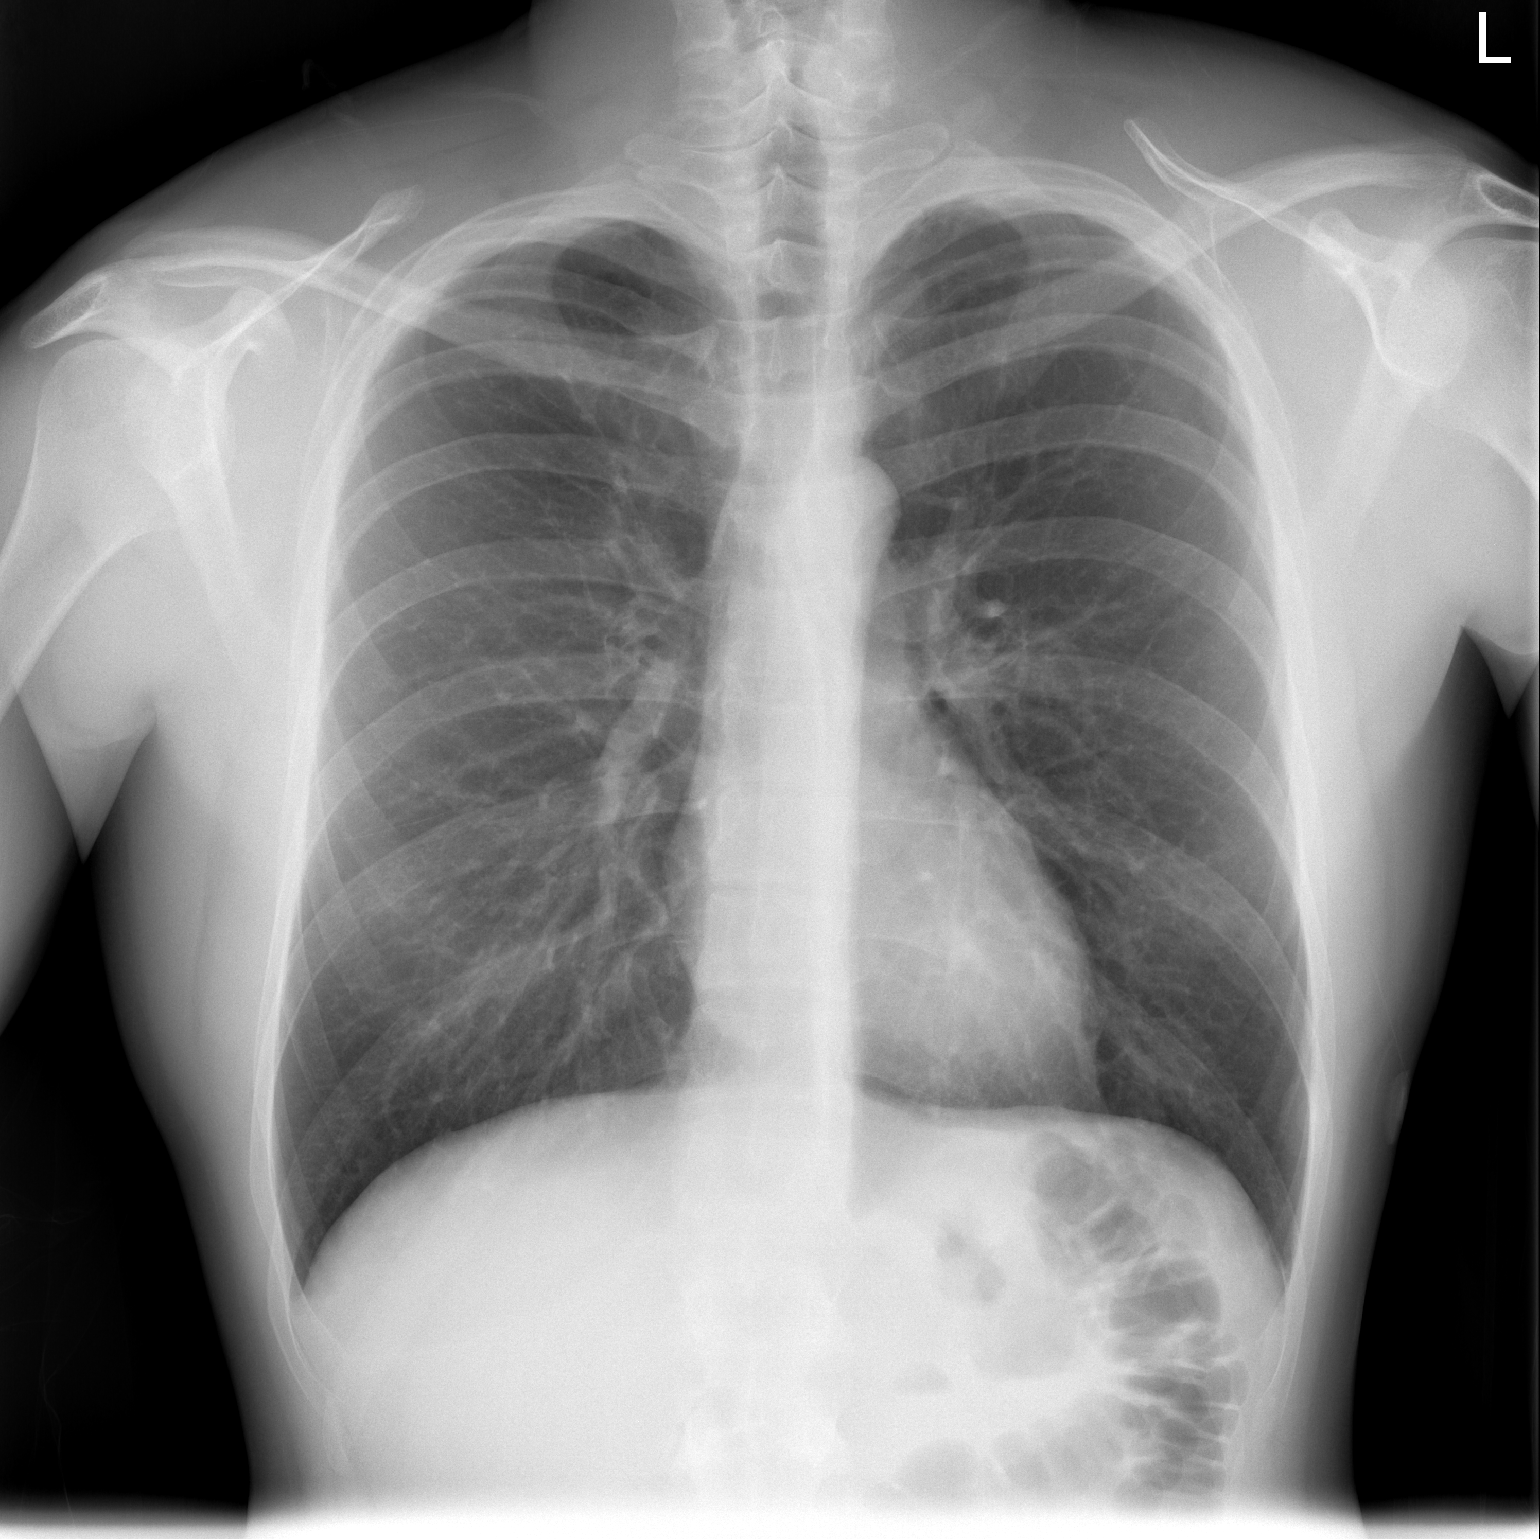

[w chest lat]
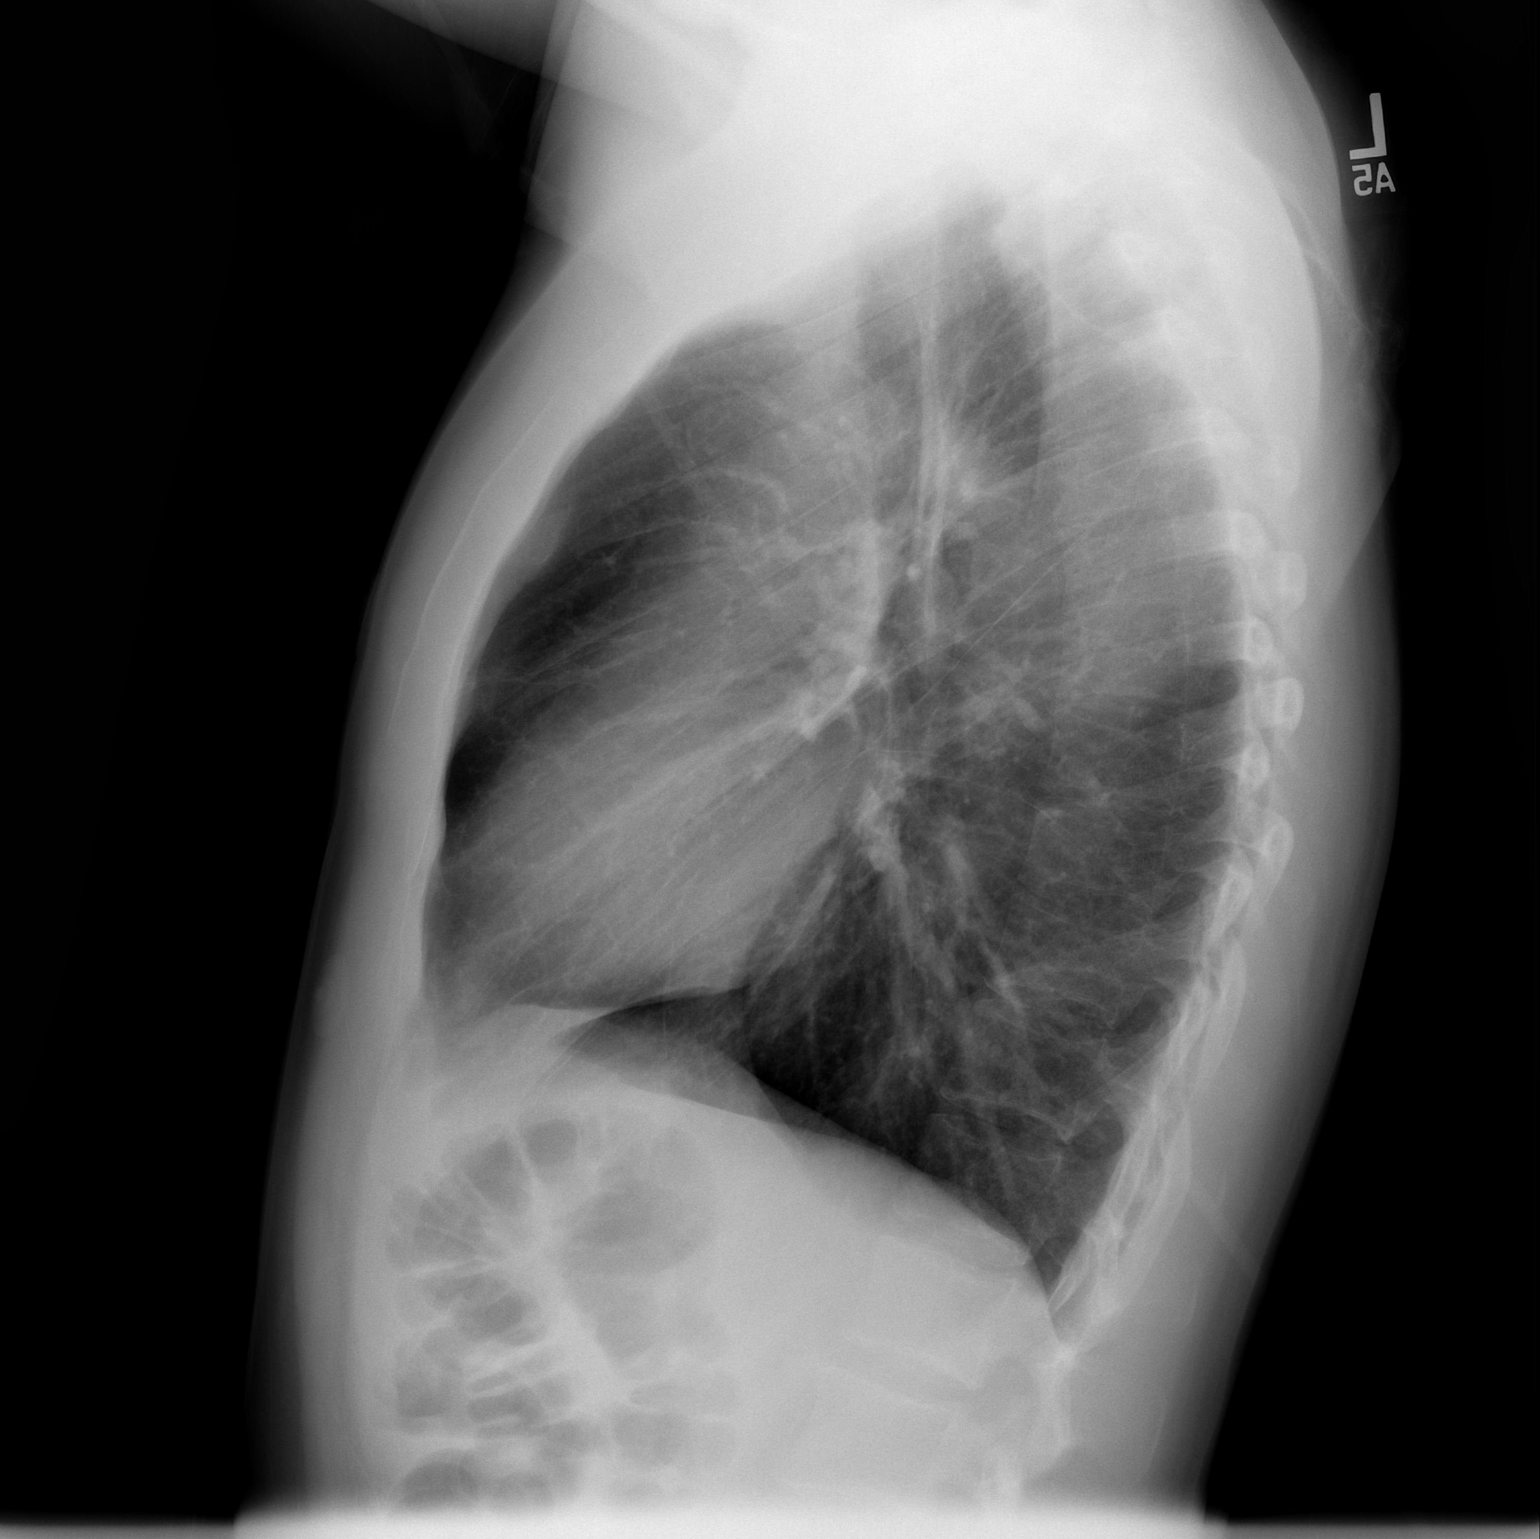

[2 of 2 positions shown; findings below may reference images not displayed]

FINDINGS: The lungs are clear. Heart size is normal. There is no pneumothorax
or pleural fluid. No bony abnormality.
IMPRESSION: Normal chest.
